# Patient Record
Sex: Male | Born: 1980 | Race: Black or African American | Hispanic: No | Marital: Married | Smoking: Never smoker
Health system: Southern US, Community
[De-identification: ages and names within clinical notes are randomized; demographics above are authoritative.]

---

## 2018-02-05 ENCOUNTER — Other Ambulatory Visit: Payer: Self-pay | Admitting: Internal Medicine

## 2018-02-05 ENCOUNTER — Ambulatory Visit
Admission: RE | Admit: 2018-02-05 | Discharge: 2018-02-05 | Disposition: A | Discharge status: Home / Self Care | Payer: No Typology Code available for payment source | Source: Ambulatory Visit | Attending: Internal Medicine | Admitting: Internal Medicine

## 2018-02-05 DIAGNOSIS — Z111 Encounter for screening for respiratory tuberculosis: Secondary | ICD-10-CM

## 2018-03-01 ENCOUNTER — Emergency Department (HOSPITAL_COMMUNITY): Payer: Medicaid Other

## 2018-03-01 ENCOUNTER — Encounter (HOSPITAL_COMMUNITY): Payer: Self-pay | Admitting: Emergency Medicine

## 2018-03-01 ENCOUNTER — Emergency Department (HOSPITAL_COMMUNITY)
Admission: EM | Admit: 2018-03-01 | Discharge: 2018-03-02 | Disposition: A | Discharge status: Home / Self Care | Payer: Medicaid Other | Attending: Emergency Medicine | Admitting: Emergency Medicine

## 2018-03-01 DIAGNOSIS — Y9389 Activity, other specified: Secondary | ICD-10-CM | POA: Diagnosis not present

## 2018-03-01 DIAGNOSIS — Y998 Other external cause status: Secondary | ICD-10-CM | POA: Insufficient documentation

## 2018-03-01 DIAGNOSIS — F10920 Alcohol use, unspecified with intoxication, uncomplicated: Secondary | ICD-10-CM | POA: Diagnosis not present

## 2018-03-01 DIAGNOSIS — M79605 Pain in left leg: Secondary | ICD-10-CM | POA: Diagnosis present

## 2018-03-01 DIAGNOSIS — M25562 Pain in left knee: Secondary | ICD-10-CM

## 2018-03-01 DIAGNOSIS — R4182 Altered mental status, unspecified: Secondary | ICD-10-CM | POA: Diagnosis not present

## 2018-03-01 DIAGNOSIS — Y929 Unspecified place or not applicable: Secondary | ICD-10-CM | POA: Diagnosis not present

## 2018-03-01 LAB — I-STAT CG4 LACTIC ACID, ED: Lactic Acid, Venous: 2.46 mmol/L (ref 0.5–1.9)

## 2018-03-01 LAB — PROTIME-INR
INR: 1.05
Prothrombin Time: 13.6 seconds (ref 11.4–15.2)

## 2018-03-01 LAB — I-STAT CHEM 8, ED
BUN: 8 mg/dL (ref 6–20)
CALCIUM ION: 1 mmol/L — AB (ref 1.15–1.40)
Chloride: 103 mmol/L (ref 98–111)
Creatinine, Ser: 1.3 mg/dL — ABNORMAL HIGH (ref 0.61–1.24)
Glucose, Bld: 79 mg/dL (ref 70–99)
HEMATOCRIT: 47 % (ref 39.0–52.0)
HEMOGLOBIN: 16 g/dL (ref 13.0–17.0)
Potassium: 3.3 mmol/L — ABNORMAL LOW (ref 3.5–5.1)
SODIUM: 140 mmol/L (ref 135–145)
TCO2: 22 mmol/L (ref 22–32)

## 2018-03-01 LAB — CBC
HCT: 46.2 % (ref 39.0–52.0)
HEMOGLOBIN: 15.4 g/dL (ref 13.0–17.0)
MCH: 31.6 pg (ref 26.0–34.0)
MCHC: 33.3 g/dL (ref 30.0–36.0)
MCV: 94.7 fL (ref 78.0–100.0)
PLATELETS: 242 10*3/uL (ref 150–400)
RBC: 4.88 MIL/uL (ref 4.22–5.81)
RDW: 13.2 % (ref 11.5–15.5)
WBC: 9.7 10*3/uL (ref 4.0–10.5)

## 2018-03-01 LAB — SAMPLE TO BLOOD BANK

## 2018-03-01 MED ORDER — IOHEXOL 300 MG/ML  SOLN
100.0000 mL | Freq: Once | INTRAMUSCULAR | Status: AC | PRN
  Administered 2018-03-02: 100 mL via INTRAVENOUS

## 2018-03-01 MED ORDER — MORPHINE SULFATE (PF) 4 MG/ML IV SOLN
INTRAVENOUS | Status: AC
  Filled 2018-03-01: qty 1

## 2018-03-01 MED ORDER — MORPHINE SULFATE 2 MG/ML IJ SOLN
INTRAMUSCULAR | Status: AC | PRN
  Administered 2018-03-01: 4 mg via INTRAVENOUS

## 2018-03-01 MED ORDER — HYDROMORPHONE HCL 1 MG/ML IJ SOLN
1.0000 mg | Freq: Once | INTRAMUSCULAR | Status: AC
  Administered 2018-03-01: 1 mg via INTRAVENOUS
  Filled 2018-03-01: qty 1

## 2018-03-01 MED ORDER — ONDANSETRON HCL 4 MG/2ML IJ SOLN
INTRAMUSCULAR | Status: AC
  Administered 2018-03-01: 4 mg
  Filled 2018-03-01: qty 2

## 2018-03-01 NOTE — ED Notes (Signed)
I-Stat Lactic Acid results of 2.46 reported to Dr. Jacqulyn BathLong

## 2018-03-01 NOTE — ED Notes (Signed)
Pt calm and cooperative at this time. Transported to CT.

## 2018-03-01 NOTE — ED Notes (Signed)
Pt appears agitated, attempting to get out of bed. Dr. Wilkie AyeHorton aware.
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
## 2018-03-02 LAB — COMPREHENSIVE METABOLIC PANEL
ALBUMIN: 3.8 g/dL (ref 3.5–5.0)
ALK PHOS: 65 U/L (ref 38–126)
ALT: 21 U/L (ref 0–44)
ANION GAP: 12 (ref 5–15)
AST: 32 U/L (ref 15–41)
BILIRUBIN TOTAL: 1.2 mg/dL (ref 0.3–1.2)
BUN: 11 mg/dL (ref 6–20)
CALCIUM: 8.7 mg/dL — AB (ref 8.9–10.3)
CO2: 22 mmol/L (ref 22–32)
Chloride: 105 mmol/L (ref 98–111)
Creatinine, Ser: 0.87 mg/dL (ref 0.61–1.24)
GFR calc Af Amer: >60 mL/min (ref >60)
GLUCOSE: 83 mg/dL (ref 70–99)
Potassium: 3.4 mmol/L — ABNORMAL LOW (ref 3.5–5.1)
Sodium: 139 mmol/L (ref 135–145)
TOTAL PROTEIN: 7 g/dL (ref 6.5–8.1)

## 2018-03-02 LAB — CDS SEROLOGY

## 2018-03-02 LAB — ETHANOL: ALCOHOL ETHYL (B): 318 mg/dL — AB (ref <10)

## 2018-03-02 MED ORDER — NAPROXEN 500 MG PO TABS: 500.0000 mg | ORAL_TABLET | Freq: Two times a day (BID) | ORAL | 0 refills | Status: DC
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
## 2018-03-04 ENCOUNTER — Other Ambulatory Visit: Payer: Self-pay

## 2018-03-04 ENCOUNTER — Ambulatory Visit (INDEPENDENT_AMBULATORY_CARE_PROVIDER_SITE_OTHER): Payer: Medicaid Other | Admitting: Family Medicine

## 2018-03-04 VITALS — BP 104/70 | HR 64 | Temp 98.6°F | Ht 72.0 in | Wt 145.4 lb

## 2018-03-04 DIAGNOSIS — Z0289 Encounter for other administrative examinations: Secondary | ICD-10-CM | POA: Insufficient documentation

## 2018-03-04 DIAGNOSIS — K0889 Other specified disorders of teeth and supporting structures: Secondary | ICD-10-CM | POA: Insufficient documentation

## 2018-03-04 DIAGNOSIS — Z603 Acculturation difficulty: Secondary | ICD-10-CM | POA: Insufficient documentation

## 2018-03-04 DIAGNOSIS — F1010 Alcohol abuse, uncomplicated: Secondary | ICD-10-CM | POA: Insufficient documentation

## 2018-03-04 LAB — POCT URINALYSIS DIP (MANUAL ENTRY)
Bilirubin, UA: NEGATIVE
Blood, UA: NEGATIVE
Glucose, UA: NEGATIVE mg/dL
LEUKOCYTES UA: NEGATIVE
Nitrite, UA: NEGATIVE
PROTEIN UA: NEGATIVE mg/dL
Spec Grav, UA: 1.02 (ref 1.010–1.025)
UROBILINOGEN UA: 1 U/dL
pH, UA: 7 (ref 5.0–8.0)

## 2018-03-04 MED ORDER — IBUPROFEN 200 MG PO TABS: 400.0000 mg | ORAL_TABLET | Freq: Four times a day (QID) | ORAL | 0 refills | Status: DC | PRN
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
## 2018-03-06 LAB — URINE CULTURE

## 2018-03-07 NOTE — Assessment & Plan Note (Signed)
Speaks [place] -- needs interpreter.

## 2018-03-07 NOTE — Assessment & Plan Note (Signed)
doing well, needs dentist

## 2018-03-07 NOTE — Assessment & Plan Note (Signed)
Awaiting FU with [facility] resettlement agency who will ensure dental appt
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
## 2018-03-11 LAB — CBC WITH DIFFERENTIAL/PLATELET
BASOS ABS: 0 10*3/uL (ref 0.0–0.2)
Basos: 0 %
EOS (ABSOLUTE): 0.2 10*3/uL (ref 0.0–0.4)
Eos: 3 %
HEMOGLOBIN: 16.5 g/dL (ref 13.0–17.7)
Hematocrit: 48.3 % (ref 37.5–51.0)
Immature Grans (Abs): 0 10*3/uL (ref 0.0–0.1)
Immature Granulocytes: 0 %
Lymphocytes Absolute: 2.4 10*3/uL (ref 0.7–3.1)
Lymphs: 41 %
MCH: 31.5 pg (ref 26.6–33.0)
MCHC: 34.2 g/dL (ref 31.5–35.7)
MCV: 92 fL (ref 79–97)
MONOS ABS: 0.5 10*3/uL (ref 0.1–0.9)
Monocytes: 9 %
Neutrophils Absolute: 2.8 10*3/uL (ref 1.4–7.0)
Neutrophils: 47 %
PLATELETS: 253 10*3/uL (ref 150–450)
RBC: 5.23 x10E6/uL (ref 4.14–5.80)
RDW: 13.7 % (ref 12.3–15.4)
WBC: 5.9 10*3/uL (ref 3.4–10.8)

## 2018-03-11 LAB — COMPREHENSIVE METABOLIC PANEL
ALK PHOS: 84 IU/L (ref 39–117)
ALT: 22 IU/L (ref 0–44)
AST: 40 IU/L (ref 0–40)
Albumin/Globulin Ratio: 1.4 (ref 1.2–2.2)
Albumin: 4.6 g/dL (ref 3.5–5.5)
BILIRUBIN TOTAL: 1.2 mg/dL (ref 0.0–1.2)
BUN/Creatinine Ratio: 15 (ref 9–20)
BUN: 12 mg/dL (ref 6–20)
CHLORIDE: 99 mmol/L (ref 96–106)
CO2: 20 mmol/L (ref 20–29)
Calcium: 9.7 mg/dL (ref 8.7–10.2)
Creatinine, Ser: 0.82 mg/dL (ref 0.76–1.27)
GFR calc Af Amer: 132 mL/min/{1.73_m2} (ref >59)
GFR calc non Af Amer: 114 mL/min/{1.73_m2} (ref >59)
GLUCOSE: 91 mg/dL (ref 65–99)
Globulin, Total: 3.4 g/dL (ref 1.5–4.5)
POTASSIUM: 4.2 mmol/L (ref 3.5–5.2)
Sodium: 141 mmol/L (ref 134–144)
Total Protein: 8 g/dL (ref 6.0–8.5)

## 2018-03-11 LAB — QUANTIFERON-TB GOLD PLUS
QUANTIFERON-TB GOLD PLUS: POSITIVE — AB
QuantiFERON Mitogen Value: >10 IU/mL
QuantiFERON Nil Value: 1.11 IU/mL
QuantiFERON TB1 Ag Value: >10 IU/mL
QuantiFERON TB2 Ag Value: 8.14 IU/mL

## 2018-03-11 LAB — HEPATITIS C ANTIBODY: Hep C Virus Ab: 0.2 s/co ratio (ref 0.0–0.9)

## 2018-03-11 LAB — RPR: RPR: NONREACTIVE

## 2018-03-11 LAB — HEPATITIS B SURFACE ANTIGEN: Hepatitis B Surface Ag: NEGATIVE

## 2018-03-11 LAB — VARICELLA ZOSTER ANTIBODY, IGG: VARICELLA: 171 {index} (ref >165)

## 2018-03-11 LAB — HIV ANTIBODY (ROUTINE TESTING W REFLEX): HIV SCREEN 4TH GENERATION: NONREACTIVE

## 2018-03-20 ENCOUNTER — Encounter: Payer: Self-pay | Admitting: Family Medicine

## 2018-03-20 DIAGNOSIS — Z8619 Personal history of other infectious and parasitic diseases: Secondary | ICD-10-CM | POA: Insufficient documentation

## 2018-03-21 ENCOUNTER — Ambulatory Visit (HOSPITAL_COMMUNITY)
Admission: EM | Admit: 2018-03-21 | Discharge: 2018-03-21 | Disposition: A | Discharge status: Home / Self Care | Payer: Medicaid Other | Attending: Emergency Medicine | Admitting: Emergency Medicine

## 2018-03-21 ENCOUNTER — Encounter (HOSPITAL_COMMUNITY): Payer: Self-pay

## 2018-03-21 DIAGNOSIS — K0889 Other specified disorders of teeth and supporting structures: Secondary | ICD-10-CM

## 2018-03-21 DIAGNOSIS — K029 Dental caries, unspecified: Secondary | ICD-10-CM | POA: Diagnosis not present

## 2018-03-21 MED ORDER — PENICILLIN V POTASSIUM 500 MG PO TABS: 500.0000 mg | ORAL_TABLET | Freq: Four times a day (QID) | ORAL | 0 refills | Status: AC

## 2018-03-21 MED ORDER — LIDOCAINE VISCOUS HCL 2 % MT SOLN
10.0000 mL | Freq: Four times a day (QID) | OROMUCOSAL | 0 refills | Status: AC | PRN
Start: 2018-03-21 — End: ?

## 2018-03-21 MED ORDER — IBUPROFEN 600 MG PO TABS: 600.0000 mg | ORAL_TABLET | Freq: Four times a day (QID) | ORAL | 0 refills | Status: DC | PRN

## 2018-03-21 MED ORDER — HYDROCODONE-ACETAMINOPHEN 5-325 MG PO TABS: 1.0000 | ORAL_TABLET | Freq: Four times a day (QID) | ORAL | 0 refills | Status: AC | PRN

## 2018-03-21 NOTE — ED Triage Notes (Signed)
Pt presents with ongoing  dental pain on left side with no relief

## 2018-03-21 NOTE — ED Provider Notes (Signed)
HPI  SUBJECTIVE:  Martin Russell is a 37 y.o. male who presents with upper midline and posterior left dental pain for several years.  States that the pain is constant, and he is unable to characterize it.  He reports gingival bleeding for the past week.  Denies facial swelling, fevers, trismus.  States that he has a dentist appointment on Monday at 1230, but cannot bear the pain.  He does not know the name of the dentist but knows where he is supposed to go.  He was seen by his primary care physician earlier this month and they discussed this, he was started on ibuprofen- appears to be 400 mg.  States that it is not helping.  Things are worse with cold water, eating.  No alleviating factors.  He has not tried anything else for this.  He has no history of liver disease, cirrhosis, diabetes, hypertension.  PMD: Dr. Gwendolyn [facility]Walden at [facility].  Patient speaks [place] only, all history obtained through an interpreter.  History reviewed. No pertinent past medical history.  History reviewed. No pertinent surgical history.  History reviewed. No pertinent family history.  Social History   Tobacco Use  . Smoking status: Never Smoker  . Smokeless tobacco: Never Used  Substance Use Topics  . Alcohol use: Not on file  . Drug use: Not on file    No current facility-administered medications for this encounter.   Current Outpatient Medications:  .  HYDROcodone-acetaminophen (NORCO/VICODIN) 5-325 MG tablet, Take 1-2 tablets by mouth every 6 (six) hours as needed for moderate pain or severe pain., Disp: 12 tablet, Rfl: 0 .  ibuprofen (ADVIL,MOTRIN) 600 MG tablet, Take 1 tablet (600 mg total) by mouth every 6 (six) hours as needed., Disp: 30 tablet, Rfl: 0 .  lidocaine (XYLOCAINE) 2 % solution, Use as directed 10 mLs in the mouth or throat every 6 (six) hours as needed for mouth pain. Hold in mouth and spit. Do not swallow., Disp: 100 mL, Rfl: 0 .  penicillin v potassium (VEETID) 500 MG tablet, Take 1  tablet (500 mg total) by mouth 4 (four) times daily for 7 days., Disp: 28 tablet, Rfl: 0  No Known Allergies   ROS  As noted in HPI.   Physical Exam  BP 97/73   Pulse 70   Temp 98.4 F (36.9 C) (Oral)   Resp 18   SpO2 100%   Constitutional: Well developed, well nourished, no acute distress Eyes:  EOMI, conjunctiva normal bilaterally HENT: Normocephalic, atraumatic,mucus membranes moist.  Poor dentition.  Tooth #8 9, central incisors absent.  Positive gingival tenderness.  No swelling.  No expressible purulent drainage.  Tooth #16, left upper third molar extensively decayed, tender to palpation.  No gingival swelling, expressible purulent drainage, bleeding.  No trismus. Neck: No cervical lymphadenopathy. Respiratory: Normal inspiratory effort Cardiovascular: Normal rate GI: nondistended skin: No rash, skin intact Musculoskeletal: no deformities Neurologic: Alert & oriented x 3, no focal neuro deficits Psychiatric: Speech and behavior appropriate   ED Course   Medications - No data to display  Orders Placed This Encounter  Procedures  . Recheck vitals    Standing Status:   Standing    Number of Occurrences:   1    No results found for this or any previous visit (from the past 24 hour(s)). No results found.  ED Clinical Impression  Pain, dental  Dental caries   ED Assessment/Plan  [place] Narcotic database reviewed for this patient, and feel that the risk/benefit ratio  today is favorable for proceeding with a prescription for controlled substance.  Patient does not have any opiate prescriptions.  Reviewed notes from [facility].  he was seen on 8/6 to establish care, they discussed tooth pain, labs including CBC drawn, and he was given ibuprofen 400 mg per chart review.  Labs reviewed.  Normal platelets.  Patient with extensive dental decay.  States that he has a dentist appointment, does not know the name, but he knows where at 1230 on Monday.   Will send him home with continued 600 mg of ibuprofen combined with 1 g of Tylenol 3 or 4 times a day, may take 600 mg of ibuprofen with 1-2 Norco instead of the ibuprofen/Tylenol combination for severe pain.  Using the language line, explained this medication schedule to the patient.  will also send home with Listerine, viscous lidocaine, penicillin.  Discussed this with patient as well.  Answered all questions using the language line.  Discussed medical decision making, plan for follow-up, treatment plan with patient.  He agrees with plan.  Meds ordered this encounter  Medications  . penicillin v potassium (VEETID) 500 MG tablet    Sig: Take 1 tablet (500 mg total) by mouth 4 (four) times daily for 7 days.    Dispense:  28 tablet    Refill:  0  . HYDROcodone-acetaminophen (NORCO/VICODIN) 5-325 MG tablet    Sig: Take 1-2 tablets by mouth every 6 (six) hours as needed for moderate pain or severe pain.    Dispense:  12 tablet    Refill:  0  . lidocaine (XYLOCAINE) 2 % solution    Sig: Use as directed 10 mLs in the mouth or throat every 6 (six) hours as needed for mouth pain. Hold in mouth and spit. Do not swallow.    Dispense:  100 mL    Refill:  0  . ibuprofen (ADVIL,MOTRIN) 600 MG tablet    Sig: Take 1 tablet (600 mg total) by mouth every 6 (six) hours as needed.    Dispense:  30 tablet    Refill:  0    *This clinic note was created using Scientist, clinical (histocompatibility and immunogenetics). Therefore, there may be occasional mistakes despite careful proofreading.   ?   Domenick Gong, MD 03/21/18 1410

## 2018-03-21 NOTE — Discharge Instructions (Addendum)
Take 600 mg of ibuprofen with a Tylenol containing product together 3 or 4 times a day as needed for pain.  Take the ibuprofen with 1 g of plain Tylenol for mild to moderate pain.  Take the ibuprofen with 1-2 of the Norco for severe pain.  Do not take the Tylenol and Norco as we discussed, take one or the other.  Do not drink any alcohol while taking the Norco.  Use the Listerine as often as you want to help clean your mouth.  The viscous lidocaine will help with the pain.  Penicillin is an antibiotic which will take care of any dental infection.  Follow-up with your dentist as scheduled on Monday at 1230.

## 2018-04-04 ENCOUNTER — Encounter (HOSPITAL_COMMUNITY): Payer: Self-pay

## 2018-04-04 ENCOUNTER — Other Ambulatory Visit: Payer: Self-pay

## 2018-04-04 ENCOUNTER — Ambulatory Visit (HOSPITAL_COMMUNITY)
Admission: EM | Admit: 2018-04-04 | Discharge: 2018-04-04 | Disposition: A | Discharge status: Home / Self Care | Payer: Medicaid Other | Attending: Internal Medicine | Admitting: Internal Medicine

## 2018-04-04 DIAGNOSIS — K0889 Other specified disorders of teeth and supporting structures: Secondary | ICD-10-CM | POA: Diagnosis not present

## 2018-04-04 MED ORDER — IBUPROFEN 800 MG PO TABS: 800.0000 mg | ORAL_TABLET | Freq: Three times a day (TID) | ORAL | 0 refills | Status: AC

## 2018-04-04 MED ORDER — KETOROLAC TROMETHAMINE 30 MG/ML IJ SOLN: 30.0000 mg | Freq: Once | INTRAMUSCULAR | Status: DC

## 2018-04-04 MED ORDER — AMOXICILLIN-POT CLAVULANATE 875-125 MG PO TABS: 1.0000 | ORAL_TABLET | Freq: Two times a day (BID) | ORAL | 0 refills | Status: AC

## 2018-04-04 MED ORDER — KETOROLAC TROMETHAMINE 30 MG/ML IJ SOLN
INTRAMUSCULAR | Status: AC
  Filled 2018-04-04: qty 1

## 2018-04-04 NOTE — Discharge Instructions (Signed)
Please use dental resource to contact offices to seek permenant treatment/relief.   Today we have given you an antibiotic. This should help with pain as any infection is cleared.  We gave you a shot of toradol for pain  For pain please take 600mg -800mg  of Ibuprofen every 8 hours, take with 1000 mg of Tylenol Extra strength every 8 hours. These are safe to take together. Please take with food.   Please return if you start to experience significant swelling of your face, experiencing fever.

## 2018-04-04 NOTE — ED Triage Notes (Signed)
Pt has a toothache. 1 month

## 2018-04-05 NOTE — ED Provider Notes (Signed)
[facility]    CSN: [id number] Arrival date & time: 04/04/18  1613     History   Chief Complaint Chief Complaint  Patient presents with  . Dental Pain    HPI [name] interpretation via [employer] interpreter. Martin Russell is a 37 y.o. male no contributing past medical history presenting today for evaluation of dental pain.  Patient states that he has had dental pain with multiple teeth especially fractured teeth and the front of his mouth off and on for the past month.  He was seen here a few weeks ago and treated with penicillin, he states that he took some of the medicine and his symptoms improved, but did not take all of it, over the past couple days his symptoms have returned.  Denies any facial swelling.  Denies any fevers.  Denies any difficulty swallowing.  Denies neck pain or difficulty moving neck.  HPI  History reviewed. No pertinent past medical history.  Patient Active Problem List   Diagnosis Date Noted  . Hx of type B viral hepatitis 03/20/2018  . Alcohol abuse 03/04/2018  . Immigrant with language difficulty 03/04/2018  . Tooth pain 03/04/2018  . Refugee health examination 03/04/2018    History reviewed. No pertinent surgical history.     Home Medications    Prior to Admission medications   Medication Sig Start Date End Date Taking? Authorizing Provider  amoxicillin-clavulanate (AUGMENTIN) 875-125 MG tablet Take 1 tablet by mouth every 12 (twelve) hours. 04/04/18   Demari Gales C, PA-C  HYDROcodone-acetaminophen (NORCO/VICODIN) 5-325 MG tablet Take 1-2 tablets by mouth every 6 (six) hours as needed for moderate pain or severe pain. 03/21/18   Domenick Gong, MD  ibuprofen (ADVIL,MOTRIN) 800 MG tablet Take 1 tablet (800 mg total) by mouth 3 (three) times daily. With food 04/04/18   Toby Ayad C, PA-C  lidocaine (XYLOCAINE) 2 % solution Use as directed 10 mLs in the mouth or throat every 6 (six) hours as needed for mouth pain. Hold in mouth and  spit. Do not swallow. 03/21/18   Domenick Gong, MD    Family History History reviewed. No pertinent family history.  Social History Social History   Tobacco Use  . Smoking status: Never Smoker  . Smokeless tobacco: Never Used  Substance Use Topics  . Alcohol use: Not on file  . Drug use: Not on file     Allergies   Patient has no known allergies.   Review of Systems Review of Systems  Constitutional: Negative for activity change, appetite change, chills, fatigue and fever.  HENT: Positive for dental problem. Negative for congestion, ear pain, facial swelling, rhinorrhea, sinus pressure, sore throat and trouble swallowing.   Eyes: Negative for discharge and redness.  Respiratory: Negative for cough, chest tightness and shortness of breath.   Cardiovascular: Negative for chest pain.  Gastrointestinal: Negative for abdominal pain, diarrhea, nausea and vomiting.  Musculoskeletal: Negative for myalgias.  Skin: Negative for rash.  Neurological: Positive for headaches. Negative for dizziness and light-headedness.     Physical Exam Triage Vital Signs ED Triage Vitals  Enc Vitals Group     BP 04/04/18 1654 96/72     Pulse Rate 04/04/18 1654 88     Resp 04/04/18 1654 16     Temp 04/04/18 1654 98.1 F (36.7 C)     Temp Source 04/04/18 1654 Oral     SpO2 04/04/18 1654 98 %     Weight 04/04/18 1652 143 lb 4.8 oz (65 kg)  Height --      Head Circumference --      Peak Flow --      Pain Score 04/04/18 1652 8     Pain Loc --      Pain Edu? --      Excl. in GC? --    No data found.  Updated Vital Signs BP 96/72 (BP Location: Right Arm)   Pulse 88   Temp 98.1 F (36.7 C) (Oral)   Resp 16   Wt 143 lb 4.8 oz (65 kg)   SpO2 98%   BMI 19.43 kg/m   Visual Acuity Right Eye Distance:   Left Eye Distance:   Bilateral Distance:    Right Eye Near:   Left Eye Near:    Bilateral Near:     Physical Exam  Constitutional: He is oriented to person, place, and time.  He appears well-developed and well-nourished.  No acute distress  HENT:  Head: Normocephalic and atraumatic.  Nose: Nose normal.  Poor dentition, missing bilateral front teeth, roots of teeth still in gum, appears brown indicating, tenderness to surrounding gingiva  Posterior pharynx patent, no uvula swelling, nontender to palpation of soft palate below the tongue  Eyes: Conjunctivae are normal.  Neck: Neck supple.  Full active range of motion of neck, no erythema or swelling  Cardiovascular: Normal rate.  Pulmonary/Chest: Effort normal. No respiratory distress.  Abdominal: He exhibits no distension.  Musculoskeletal: Normal range of motion.  Neurological: He is alert and oriented to person, place, and time.  Skin: Skin is warm and dry.  Psychiatric: He has a normal mood and affect.  Nursing note and vitals reviewed.    UC Treatments / Results  Labs (all labs ordered are listed, but only abnormal results are displayed) Labs Reviewed - No data to display  EKG None  Radiology No results found.  Procedures Procedures (including critical care time)  Medications Ordered in UC Medications - No data to display  Initial Impression / Assessment and Plan / UC Course  I have reviewed the triage vital signs and the nursing notes.  Pertinent labs & imaging results that were available during my care of the patient were reviewed by me and considered in my medical decision making (see chart for details).     Patient with dental pain, will reinitiate antibiotic with Augmentin, Tylenol and ibuprofen for pain, he still has hydrocodone from previous visit, recommended to use this for severe pain at nighttime pain.  Discussed following up with dentistry, dental resource provided.Discussed strict return precautions. Patient verbalized understanding and is agreeable with plan.  Final Clinical Impressions(s) / UC Diagnoses   Final diagnoses:  Pain, dental     Discharge Instructions      Please use dental resource to contact offices to seek permenant treatment/relief.   Today we have given you an antibiotic. This should help with pain as any infection is cleared.  We gave you a shot of toradol for pain  For pain please take 600mg -800mg  of Ibuprofen every 8 hours, take with 1000 mg of Tylenol Extra strength every 8 hours. These are safe to take together. Please take with food.   Please return if you start to experience significant swelling of your face, experiencing fever.   ED Prescriptions    Medication Sig Dispense Auth. Provider   amoxicillin-clavulanate (AUGMENTIN) 875-125 MG tablet Take 1 tablet by mouth every 12 (twelve) hours. 14 tablet Rhayne Chatwin C, PA-C   ibuprofen (ADVIL,MOTRIN) 800 MG tablet Take  1 tablet (800 mg total) by mouth 3 (three) times daily. With food 21 tablet Cynithia Hakimi, [place] C, PA-C     Controlled Substance Prescriptions [place] Controlled Substance Registry consulted? Not Applicable   Lew Dawes, [place] 04/05/18 1610

## 2018-04-09 ENCOUNTER — Ambulatory Visit: Payer: Medicaid Other | Admitting: Family Medicine

## 2018-04-28 ENCOUNTER — Encounter: Payer: Self-pay | Admitting: Family Medicine

## 2018-04-28 ENCOUNTER — Other Ambulatory Visit: Payer: Self-pay

## 2018-04-28 ENCOUNTER — Ambulatory Visit (INDEPENDENT_AMBULATORY_CARE_PROVIDER_SITE_OTHER): Payer: Medicaid Other | Admitting: Family Medicine

## 2018-04-28 ENCOUNTER — Ambulatory Visit (HOSPITAL_COMMUNITY)
Admission: RE | Admit: 2018-04-28 | Discharge: 2018-04-28 | Disposition: A | Discharge status: Home / Self Care | Payer: Medicaid Other | Source: Ambulatory Visit | Attending: Family Medicine | Admitting: Family Medicine

## 2018-04-28 VITALS — BP 100/74 | HR 91 | Temp 98.8°F | Ht 72.0 in | Wt 140.0 lb

## 2018-04-28 DIAGNOSIS — G8929 Other chronic pain: Secondary | ICD-10-CM | POA: Diagnosis not present

## 2018-04-28 DIAGNOSIS — M546 Pain in thoracic spine: Secondary | ICD-10-CM

## 2018-04-28 DIAGNOSIS — R918 Other nonspecific abnormal finding of lung field: Secondary | ICD-10-CM | POA: Insufficient documentation

## 2018-04-28 MED ORDER — MELOXICAM 7.5 MG PO TABS: 7.5000 mg | ORAL_TABLET | Freq: Every day | ORAL | 0 refills | Status: AC

## 2018-04-28 NOTE — Progress Notes (Signed)
    Subjective:  Martin Russell is a 37 y.o. male who presents to the [facility] today with a chief complaint of chronic back pain.   HPI: Patient with history of chronic back pain presents to discuss his symptoms.  They have not been changing.  They have been going on "for years" since prior to his coming here under refugee status from [place].  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  He denies IV drug use, fevers, incontonence, neurological deficits distally and radiculopathy.  Pain is primarily in paraspinal musculature of mid/low thoracic.  Objective:  Physical Exam: BP 100/74   Pulse 91   Temp 98.8 F (37.1 C) (Oral)   Ht 6' (1.829 m)   Wt 140 lb (63.5 kg)   SpO2 98%   BMI 18.99 kg/m   Gen: NAD, resting comfortably CV: RRR with no murmurs appreciated Pulm: NWOB, CTAB with no crackles, wheezes, or rhonchi GI: Normal bowel sounds present. Soft, Nontender, Nondistended. MSK: no edema, cyanosis, or clubbing noted.  Expressed pain to paraspinal musculature but no palpable defects.  No obvious scoliosis to physical exam. Skin: warm, dry Neuro: grossly normal, moves all extremities, no distal deficiency Psych: Normal affect and thought content  No results found for this or any previous visit (from the past 72 hour(s)).   Assessment/Plan:  Chronic midline thoracic back pain No acute change to chronic muscular pain, will order meloxicam and XR  XR showed no significant bony abnormality  Lung nodules Found on XR, f/u CXR ordered in 3 months to check for stability.  Will discuss with patient when he comes back for f/u.   Marthenia Rolling, DO FAMILY MEDICINE RESIDENT - PGY2 05/04/2018 8:29 PM

## 2018-05-04 DIAGNOSIS — R918 Other nonspecific abnormal finding of lung field: Secondary | ICD-10-CM | POA: Insufficient documentation

## 2018-05-04 DIAGNOSIS — G8929 Other chronic pain: Secondary | ICD-10-CM | POA: Insufficient documentation

## 2018-05-04 DIAGNOSIS — M546 Pain in thoracic spine: Principal | ICD-10-CM

## 2018-05-04 NOTE — Assessment & Plan Note (Signed)
Found on XR, f/u CXR ordered in 3 months to check for stability.  Will discuss with patient when he comes back for f/u.

## 2018-05-04 NOTE — Assessment & Plan Note (Signed)
No acute change to chronic muscular pain, will order meloxicam and XR  XR showed no significant bony abnormality

## 2018-08-08 ENCOUNTER — Encounter: Payer: Self-pay | Admitting: General Practice

## 2019-06-19 ENCOUNTER — Ambulatory Visit (HOSPITAL_COMMUNITY)
Admission: EM | Admit: 2019-06-19 | Discharge: 2019-06-19 | Disposition: A | Discharge status: Home / Self Care | Payer: BC Managed Care – PPO | Attending: Emergency Medicine | Admitting: Emergency Medicine

## 2019-06-19 ENCOUNTER — Encounter (HOSPITAL_COMMUNITY): Payer: Self-pay

## 2019-06-19 ENCOUNTER — Ambulatory Visit (INDEPENDENT_AMBULATORY_CARE_PROVIDER_SITE_OTHER): Payer: BC Managed Care – PPO

## 2019-06-19 ENCOUNTER — Other Ambulatory Visit: Payer: Self-pay

## 2019-06-19 DIAGNOSIS — S63501A Unspecified sprain of right wrist, initial encounter: Secondary | ICD-10-CM | POA: Diagnosis not present

## 2019-06-19 DIAGNOSIS — R0789 Other chest pain: Secondary | ICD-10-CM

## 2019-06-19 DIAGNOSIS — R9389 Abnormal findings on diagnostic imaging of other specified body structures: Secondary | ICD-10-CM | POA: Diagnosis not present

## 2019-06-19 MED ORDER — IBUPROFEN 800 MG PO TABS
ORAL_TABLET | ORAL | Status: AC
  Filled 2019-06-19: qty 1

## 2019-06-19 MED ORDER — NAPROXEN 500 MG PO TABS: 500.0000 mg | ORAL_TABLET | Freq: Two times a day (BID) | ORAL | 0 refills | Status: AC

## 2019-06-19 MED ORDER — IBUPROFEN 800 MG PO TABS
800.0000 mg | ORAL_TABLET | Freq: Once | ORAL | Status: AC
  Administered 2019-06-19: 19:00:00 800 mg via ORAL
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
## 2019-06-19 NOTE — Discharge Instructions (Signed)
Ice application to wrist, elevation, to help with pain. Wrist splint for the next week to help with pain, especially while working, and then wean out of it as able.  Naproxen twice a day to help with pain, take with food.  Please follow up with your primary care provider, call next week to schedule appointment, for further evaluation and potentially referral for abnormal findings on your chest xray.  Please go to the ER for any worsening of chest pain , shortness of breath  or fevers.
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
## 2020-03-23 ENCOUNTER — Encounter: Payer: Self-pay | Admitting: Family Medicine

## 2020-03-23 ENCOUNTER — Ambulatory Visit (INDEPENDENT_AMBULATORY_CARE_PROVIDER_SITE_OTHER): Payer: Self-pay | Admitting: Family Medicine

## 2020-03-23 ENCOUNTER — Other Ambulatory Visit: Payer: Self-pay

## 2020-03-23 VITALS — BP 90/60 | HR 68 | Ht 72.0 in | Wt 136.0 lb

## 2020-03-23 DIAGNOSIS — A630 Anogenital (venereal) warts: Secondary | ICD-10-CM

## 2020-03-23 MED ORDER — PODOFILOX 0.5 % EX SOLN: Freq: Two times a day (BID) | CUTANEOUS | 0 refills | Status: AC

## 2020-03-23 NOTE — Patient Instructions (Addendum)
Thank you for coming to see me today. It was a pleasure. Today we talked about:   You have warts.  Use the cream that I sent to the pharmacy.  Use it for 3 days twice a day, then stop for 4 days.  You can repeat this every week for 4 weeks.  If no improvement after that, please come back.  We will also check your lab work, if it is normal we will send you a letter, otherwise we will call.  If you have any questions or concerns, please do not hesitate to call the office at [phone].  Best,   Luis Abed, DO

## 2020-03-23 NOTE — Progress Notes (Signed)
    SUBJECTIVE:   CHIEF COMPLAINT / HPI:   Anal Bumps Have been present for many months Thinks he scratched one recently and caused it to be painful Has pain with BMs Denies hematochezia or melena Patient is married, never had intercourse with men NR HIV 2 years ago  [employer] used for entirety of encounter   PERTINENT  PMH / PSH: Hx alcohol abuse, refugee, hx possible Hep B per HD labs but negative Surface Ag on labs on refugee exam  OBJECTIVE:   BP 90/60   Pulse 68   Ht 6' (1.829 m)   Wt 136 lb (61.7 kg)   SpO2 98%   BMI 18.44 kg/m    Physical Exam:  General: 39 y.o. male in NAD Lungs: Breathing comfortably on RA Anal Exam: Chaperone in room.  Multiple warts surrounding anal opening.  No bleeding or external hemorrhoids noted. Skin: warm and dry Extremities: No edema, ambulating without difficulty    ASSESSMENT/PLAN:   Anal warts NR HIV on refugee examination.  Will repeat today.  Denies intercourse with men in the past.  Given numerous warts and location, would avoid cryotherapy at this time.  Precepted with Dr. Lum Babe.  Will trial topical podofilox.  Patient advised to use only on affected area 3 days, then stops 4 days, then can repeat for 4 weeks of treatment.  Return to care if no improvement.  Consider derm referral if no improvement.  Patient thinks he will be getting insurance soon from his new job.     Martin Jim, DO [facility]

## 2020-03-24 ENCOUNTER — Telehealth: Payer: Self-pay | Admitting: *Deleted

## 2020-03-24 ENCOUNTER — Encounter: Payer: Self-pay | Admitting: Family Medicine

## 2020-03-24 DIAGNOSIS — A630 Anogenital (venereal) warts: Secondary | ICD-10-CM | POA: Insufficient documentation

## 2020-03-24 LAB — HIV ANTIBODY (ROUTINE TESTING W REFLEX): HIV Screen 4th Generation wRfx: NONREACTIVE

## 2020-03-24 NOTE — Assessment & Plan Note (Signed)
NR HIV on refugee examination.  Will repeat today.  Denies intercourse with men in the past.  Given numerous warts and location, would avoid cryotherapy at this time.  Precepted with Dr. Lum Babe.  Will trial topical podofilox.  Patient advised to use only on affected area 3 days, then stops 4 days, then can repeat for 4 weeks of treatment.  Return to care if no improvement.  Consider derm referral if no improvement.  Patient thinks he will be getting insurance soon from his new job.

## 2020-03-24 NOTE — Telephone Encounter (Signed)
Routed this message to Dr. Obie Dredge who evaluated him yesterday.  Allayne Stack, DO

## 2020-03-24 NOTE — Telephone Encounter (Signed)
Pt has a friend call because the medication we sent in will cost $65 and he cant afford that.  Please call  Patient back with an interpretor when different med is called in.  [facility] - [place] and [address]

## 2020-03-25 ENCOUNTER — Encounter: Payer: Self-pay | Admitting: Family Medicine

## 2020-03-25 NOTE — Telephone Encounter (Signed)
Called patient with interpretor.  No cheaper medication but with Good Rx coupon, can be $38.  He states he can afford this.  Will print and leave at front desk for him.  He also asked for work excuse for his appointment on 8/25, letter written and left at front desk as well.
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
## 2020-07-12 ENCOUNTER — Telehealth: Payer: Self-pay

## 2020-07-12 ENCOUNTER — Ambulatory Visit: Payer: Self-pay
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
## 2020-07-30 DEATH — deceased
